# Patient Record
Sex: Female | Born: 1955 | Race: White | Hispanic: No | Smoking: Never smoker
Health system: Southern US, Community
[De-identification: ages and names within clinical notes are randomized; demographics above are authoritative.]

## PROBLEM LIST (undated history)

## (undated) DIAGNOSIS — E079 Disorder of thyroid, unspecified: Secondary | ICD-10-CM

## (undated) DIAGNOSIS — I639 Cerebral infarction, unspecified: Secondary | ICD-10-CM

## (undated) HISTORY — PX: TONSILLECTOMY: SUR1361

## (undated) HISTORY — PX: ADENOIDECTOMY: SUR15

## (undated) HISTORY — PX: CHOLECYSTECTOMY: SHX55

## (undated) HISTORY — PX: WRIST SURGERY: SHX841

## (undated) HISTORY — PX: APPENDECTOMY: SHX54

---

## 2016-11-20 ENCOUNTER — Emergency Department (HOSPITAL_COMMUNITY): Payer: Medicaid Other

## 2016-11-20 ENCOUNTER — Observation Stay (HOSPITAL_COMMUNITY)
Admission: EM | Admit: 2016-11-20 | Discharge: 2016-11-21 | Disposition: A | Discharge status: Home / Self Care | Payer: Medicaid Other | Attending: Family Medicine | Admitting: Family Medicine

## 2016-11-20 ENCOUNTER — Observation Stay (HOSPITAL_COMMUNITY): Payer: Medicaid Other

## 2016-11-20 ENCOUNTER — Encounter (HOSPITAL_COMMUNITY): Payer: Self-pay

## 2016-11-20 DIAGNOSIS — R531 Weakness: Secondary | ICD-10-CM | POA: Diagnosis not present

## 2016-11-20 DIAGNOSIS — E039 Hypothyroidism, unspecified: Secondary | ICD-10-CM | POA: Diagnosis present

## 2016-11-20 DIAGNOSIS — R079 Chest pain, unspecified: Secondary | ICD-10-CM

## 2016-11-20 DIAGNOSIS — R072 Precordial pain: Principal | ICD-10-CM | POA: Insufficient documentation

## 2016-11-20 DIAGNOSIS — R202 Paresthesia of skin: Secondary | ICD-10-CM | POA: Diagnosis present

## 2016-11-20 DIAGNOSIS — R200 Anesthesia of skin: Secondary | ICD-10-CM | POA: Diagnosis not present

## 2016-11-20 DIAGNOSIS — E038 Other specified hypothyroidism: Secondary | ICD-10-CM | POA: Diagnosis not present

## 2016-11-20 DIAGNOSIS — I639 Cerebral infarction, unspecified: Secondary | ICD-10-CM

## 2016-11-20 HISTORY — DX: Disorder of thyroid, unspecified: E07.9

## 2016-11-20 HISTORY — DX: Cerebral infarction, unspecified: I63.9

## 2016-11-20 LAB — DIFFERENTIAL
BASOS ABS: 0.1 10*3/uL (ref 0.0–0.1)
BASOS PCT: 1 %
EOS ABS: 0.1 10*3/uL (ref 0.0–0.7)
EOS PCT: 2 %
Lymphocytes Relative: 42 %
Lymphs Abs: 2.7 10*3/uL (ref 0.7–4.0)
Monocytes Absolute: 0.4 10*3/uL (ref 0.1–1.0)
Monocytes Relative: 7 %
NEUTROS PCT: 48 %
Neutro Abs: 3.2 10*3/uL (ref 1.7–7.7)

## 2016-11-20 LAB — CBC
HCT: 43.1 % (ref 36.0–46.0)
Hemoglobin: 14.8 g/dL (ref 12.0–15.0)
MCH: 31 pg (ref 26.0–34.0)
MCHC: 34.3 g/dL (ref 30.0–36.0)
MCV: 90.2 fL (ref 78.0–100.0)
Platelets: 283 10*3/uL (ref 150–400)
RBC: 4.78 MIL/uL (ref 3.87–5.11)
RDW: 13 % (ref 11.5–15.5)
WBC: 6.5 10*3/uL (ref 4.0–10.5)

## 2016-11-20 LAB — I-STAT CHEM 8, ED
BUN: 15 mg/dL (ref 6–20)
CALCIUM ION: 1.12 mmol/L — AB (ref 1.15–1.40)
Chloride: 107 mmol/L (ref 101–111)
Creatinine, Ser: 0.7 mg/dL (ref 0.44–1.00)
Glucose, Bld: 100 mg/dL — ABNORMAL HIGH (ref 65–99)
HCT: 42 % (ref 36.0–46.0)
HEMOGLOBIN: 14.3 g/dL (ref 12.0–15.0)
Potassium: 3.8 mmol/L (ref 3.5–5.1)
SODIUM: 139 mmol/L (ref 135–145)
TCO2: 23 mmol/L (ref 0–100)

## 2016-11-20 LAB — PROTIME-INR
INR: 0.88
PROTHROMBIN TIME: 11.9 s (ref 11.4–15.2)

## 2016-11-20 LAB — I-STAT TROPONIN, ED: Troponin i, poc: 0 ng/mL (ref 0.00–0.08)

## 2016-11-20 LAB — TROPONIN I

## 2016-11-20 LAB — COMPREHENSIVE METABOLIC PANEL
ALT: 28 U/L (ref 14–54)
ANION GAP: 10 (ref 5–15)
AST: 20 U/L (ref 15–41)
Albumin: 4.1 g/dL (ref 3.5–5.0)
Alkaline Phosphatase: 91 U/L (ref 38–126)
BUN: 15 mg/dL (ref 6–20)
CHLORIDE: 103 mmol/L (ref 101–111)
CO2: 25 mmol/L (ref 22–32)
Calcium: 9.3 mg/dL (ref 8.9–10.3)
Creatinine, Ser: 0.86 mg/dL (ref 0.44–1.00)
GFR calc Af Amer: >60 mL/min (ref >60)
Glucose, Bld: 101 mg/dL — ABNORMAL HIGH (ref 65–99)
POTASSIUM: 3.7 mmol/L (ref 3.5–5.1)
Sodium: 138 mmol/L (ref 135–145)
TOTAL PROTEIN: 7.3 g/dL (ref 6.5–8.1)
Total Bilirubin: 0.5 mg/dL (ref 0.3–1.2)

## 2016-11-20 LAB — CBG MONITORING, ED: GLUCOSE-CAPILLARY: 102 mg/dL — AB (ref 65–99)

## 2016-11-20 LAB — D-DIMER, QUANTITATIVE (NOT AT ARMC): D DIMER QUANT: 0.37 ug{FEU}/mL (ref 0.00–0.50)

## 2016-11-20 LAB — APTT: aPTT: 30 seconds (ref 24–36)

## 2016-11-20 MED ORDER — ASPIRIN 81 MG PO CHEW: 324.0000 mg | CHEWABLE_TABLET | Freq: Once | ORAL | Status: DC

## 2016-11-20 MED ORDER — MORPHINE SULFATE (PF) 2 MG/ML IV SOLN
2.0000 mg | INTRAVENOUS | Status: AC
  Administered 2016-11-20: 2 mg via INTRAVENOUS
  Filled 2016-11-20: qty 1

## 2016-11-20 MED ORDER — ASPIRIN 300 MG RE SUPP: 300.0000 mg | Freq: Every day | RECTAL | Status: DC

## 2016-11-20 MED ORDER — ACETAMINOPHEN 160 MG/5ML PO SOLN: 650.0000 mg | ORAL | Status: DC | PRN

## 2016-11-20 MED ORDER — ENOXAPARIN SODIUM 40 MG/0.4ML ~~LOC~~ SOLN
40.0000 mg | SUBCUTANEOUS | Status: DC
  Administered 2016-11-20: 40 mg via SUBCUTANEOUS
  Filled 2016-11-20: qty 0.4

## 2016-11-20 MED ORDER — LORAZEPAM 2 MG/ML IJ SOLN
2.0000 mg | Freq: Once | INTRAMUSCULAR | Status: AC
  Administered 2016-11-20: 2 mg via INTRAVENOUS
  Filled 2016-11-20: qty 1

## 2016-11-20 MED ORDER — LEVOTHYROXINE SODIUM 25 MCG PO TABS
125.0000 ug | ORAL_TABLET | Freq: Every day | ORAL | Status: DC
  Administered 2016-11-21: 125 ug via ORAL
  Filled 2016-11-20: qty 1

## 2016-11-20 MED ORDER — ACETAMINOPHEN 325 MG PO TABS: 650.0000 mg | ORAL_TABLET | ORAL | Status: DC | PRN

## 2016-11-20 MED ORDER — ACETAMINOPHEN 650 MG RE SUPP: 650.0000 mg | RECTAL | Status: DC | PRN

## 2016-11-20 MED ORDER — SENNOSIDES-DOCUSATE SODIUM 8.6-50 MG PO TABS: 1.0000 | ORAL_TABLET | Freq: Every evening | ORAL | Status: DC | PRN

## 2016-11-20 MED ORDER — ASPIRIN 325 MG PO TABS
325.0000 mg | ORAL_TABLET | Freq: Every day | ORAL | Status: DC
  Administered 2016-11-20 – 2016-11-21 (×2): 325 mg via ORAL
  Filled 2016-11-20 (×2): qty 1

## 2016-11-20 MED ORDER — STROKE: EARLY STAGES OF RECOVERY BOOK
Freq: Once | Status: AC
  Administered 2016-11-20: 18:00:00
  Filled 2016-11-20: qty 1

## 2016-11-20 MED ORDER — SODIUM CHLORIDE 0.9 % IV SOLN: INTRAVENOUS | Status: DC

## 2016-11-20 NOTE — ED Provider Notes (Signed)
[facility]-EMERGENCY DEPT Provider Note   CSN: [id number] Arrival date & time: 11/20/16  1241   An emergency department physician performed an initial assessment on this suspected stroke patient at 1250.  History   Chief Complaint Chief Complaint  Patient presents with  . facial numbness  . Chest Pain    HPI Valerie Turner is a 61 y.o. female.  Patient presents with 2 complaints. One is chest pain the other is left-sided facial numbness. Chest pain complaint is been a discomfort substernal lasting of 2 days but it is intermittent. The other is that about an hour prior to arrival developed left-sided facial numbness similar to her stroke in 2004. No motor deficits no speech problems. No trouble with the tongue. Patient is originally from the [place] area. That was in [place] with her daughter and drove home from [place] came to this area could she has follow-up appointments at [facility] medically patient's daughter does she got in around 11:00 so she was taking a nap today when she awoke at 1145 started having numbness to the face.      Past Medical History:  Diagnosis Date  . Stroke (HCC)   . Thyroid disease     There are no active problems to display for this patient.   Past Surgical History:  Procedure Laterality Date  . ADENOIDECTOMY    . CHOLECYSTECTOMY    . TONSILLECTOMY    . WRIST SURGERY      OB History    No data available       Home Medications    Prior to Admission medications   Not on File    Family History No family history on file.  Social History Social History  Substance Use Topics  . Smoking status: Never Smoker  . Smokeless tobacco: Never Used  . Alcohol use No     Allergies   Other   Review of Systems Review of Systems  Constitutional: Negative for fever.  HENT: Negative for congestion.   Eyes: Negative for visual disturbance.  Respiratory: Negative for shortness of breath.   Cardiovascular: Positive for chest pain.    Gastrointestinal: Negative for abdominal pain, nausea and vomiting.  Genitourinary: Negative for dysuria.  Musculoskeletal: Negative for neck pain.  Skin: Negative for rash.  Neurological: Positive for numbness. Negative for facial asymmetry and headaches.  Hematological: Does not bruise/bleed easily.  Psychiatric/Behavioral: Negative for confusion.     Physical Exam Updated Vital Signs BP 128/83   Pulse 90   Temp 98.1 F (36.7 C)   Resp 19   Ht 1.626 m (5\' 4" )   Wt 88.5 kg (195 lb)   SpO2 94%   BMI 33.47 kg/m   Physical Exam  Constitutional: She is oriented to person, place, and time. She appears well-developed and well-nourished.  HENT:  Head: Normocephalic and atraumatic.  Mouth/Throat: Oropharynx is clear and moist.  Eyes: Conjunctivae and EOM are normal. Pupils are equal, round, and reactive to light.  Neck: Normal range of motion. Neck supple.  Cardiovascular: Normal rate, regular rhythm and normal heart sounds.   Pulmonary/Chest: Effort normal and breath sounds normal.  Abdominal: Soft. Bowel sounds are normal. There is no tenderness.  Musculoskeletal: Normal range of motion. She exhibits no edema.  Neurological: She is alert and oriented to person, place, and time. A cranial nerve deficit is present. No sensory deficit. She exhibits normal muscle tone. Coordination normal.  Patient without any facial weakness but does strictly has numbness to the left side  of the face. No motor or sensory deficit. No obvious speech problem no visual changes.  Skin: Skin is warm.  Nursing note and vitals reviewed.    ED Treatments / Results  Labs (all labs ordered are listed, but only abnormal results are displayed) Labs Reviewed  COMPREHENSIVE METABOLIC PANEL - Abnormal; Notable for the following:       Result Value   Glucose, Bld 101 (*)    All other components within normal limits  CBG MONITORING, ED - Abnormal; Notable for the following:    Glucose-Capillary 102 (*)     All other components within normal limits  I-STAT CHEM 8, ED - Abnormal; Notable for the following:    Glucose, Bld 100 (*)    Calcium, Ion 1.12 (*)    All other components within normal limits  PROTIME-INR  APTT  CBC  DIFFERENTIAL  I-STAT TROPOININ, ED   Results for orders placed or performed during the hospital encounter of 11/20/16  Protime-INR  Result Value Ref Range   Prothrombin Time 11.9 11.4 - 15.2 seconds   INR 0.88   APTT  Result Value Ref Range   aPTT 30 24 - 36 seconds  CBC  Result Value Ref Range   WBC 6.5 4.0 - 10.5 K/uL   RBC 4.78 3.87 - 5.11 MIL/uL   Hemoglobin 14.8 12.0 - 15.0 g/dL   HCT 16.1 09.6 - 04.5 %   MCV 90.2 78.0 - 100.0 fL   MCH 31.0 26.0 - 34.0 pg   MCHC 34.3 30.0 - 36.0 g/dL   RDW 40.9 81.1 - 91.4 %   Platelets 283 150 - 400 K/uL  Differential  Result Value Ref Range   Neutrophils Relative % 48 %   Neutro Abs 3.2 1.7 - 7.7 K/uL   Lymphocytes Relative 42 %   Lymphs Abs 2.7 0.7 - 4.0 K/uL   Monocytes Relative 7 %   Monocytes Absolute 0.4 0.1 - 1.0 K/uL   Eosinophils Relative 2 %   Eosinophils Absolute 0.1 0.0 - 0.7 K/uL   Basophils Relative 1 %   Basophils Absolute 0.1 0.0 - 0.1 K/uL  Comprehensive metabolic panel  Result Value Ref Range   Sodium 138 135 - 145 mmol/L   Potassium 3.7 3.5 - 5.1 mmol/L   Chloride 103 101 - 111 mmol/L   CO2 25 22 - 32 mmol/L   Glucose, Bld 101 (H) 65 - 99 mg/dL   BUN 15 6 - 20 mg/dL   Creatinine, Ser 7.82 0.44 - 1.00 mg/dL   Calcium 9.3 8.9 - 95.6 mg/dL   Total Protein 7.3 6.5 - 8.1 g/dL   Albumin 4.1 3.5 - 5.0 g/dL   AST 20 15 - 41 U/L   ALT 28 14 - 54 U/L   Alkaline Phosphatase 91 38 - 126 U/L   Total Bilirubin 0.5 0.3 - 1.2 mg/dL   GFR calc non Af Amer >60 >60 mL/min   GFR calc Af Amer >60 >60 mL/min   Anion gap 10 5 - 15  I-stat troponin, ED  Result Value Ref Range   Troponin i, poc 0.00 0.00 - 0.08 ng/mL   Comment 3          CBG monitoring, ED  Result Value Ref Range   Glucose-Capillary  102 (H) 65 - 99 mg/dL  I-Stat Chem 8, ED  Result Value Ref Range   Sodium 139 135 - 145 mmol/L   Potassium 3.8 3.5 - 5.1 mmol/L   Chloride 107 101 -  111 mmol/L   BUN 15 6 - 20 mg/dL   Creatinine, Ser 9.60 0.44 - 1.00 mg/dL   Glucose, Bld 454 (H) 65 - 99 mg/dL   Calcium, Ion 0.98 (L) 1.15 - 1.40 mmol/L   TCO2 23 0 - 100 mmol/L   Hemoglobin 14.3 12.0 - 15.0 g/dL   HCT 11.9 14.7 - 82.9 %     EKG  EKG Interpretation  Date/Time:  Tuesday Nov 20 2016 13:03:29 EDT Ventricular Rate:  91 PR Interval:    QRS Duration: 95 QT Interval:  367 QTC Calculation: 452 R Axis:   38 Text Interpretation:  Sinus rhythm Low voltage, precordial leads Baseline wander in lead(s) V3 V4 No previous ECGs available Confirmed by Vanetta Mulders [phone]) on 11/20/2016 1:09:56 PM       Radiology Ct Head Wo Contrast  Result Date: 11/20/2016 CLINICAL DATA:  Left facial numbness. EXAM: CT HEAD WITHOUT CONTRAST TECHNIQUE: Contiguous axial images were obtained from the base of the skull through the vertex without intravenous contrast. COMPARISON:  None. FINDINGS: Brain: There is no evidence of acute infarct, intracranial hemorrhage, mass, midline shift, or extra-axial fluid collection. The ventricles and sulci are normal. Vascular: No hyperdense vessel. Skull: No fracture or focal osseous lesion. Sinuses/Orbits: Visualized paranasal sinuses and mastoid air cells are clear. Orbits are unremarkable. Other: None. IMPRESSION: Negative head CT. These results were called by telephone at the time of interpretation on 11/20/2016 at 1:06 pm to Dr. Clarene Duke, who verbally acknowledged these results. Electronically Signed   By: Sebastian Ache M.D.   On: 11/20/2016 13:07   Dg Chest Port 1 View  Result Date: 11/20/2016 CLINICAL DATA:  Chest pain EXAM: PORTABLE CHEST 1 VIEW COMPARISON:  None. FINDINGS: The heart size and mediastinal contours are within normal limits. Both lungs are clear. The visualized skeletal structures are  unremarkable. IMPRESSION: No active disease. Electronically Signed   By: Marlan Palau M.D.   On: 11/20/2016 13:56    Procedures Procedures (including critical care time)  CRITICAL CARE Performed by: Vanetta Mulders Total critical care time: 30 minutes Critical care time was exclusive of separately billable procedures and treating other patients. Critical care was necessary to treat or prevent imminent or life-threatening deterioration. Critical care was time spent personally by me on the following activities: development of treatment plan with patient and/or surrogate as well as nursing, discussions with consultants, evaluation of patient's response to treatment, examination of patient, obtaining history from patient or surrogate, ordering and performing treatments and interventions, ordering and review of laboratory studies, ordering and review of radiographic studies, pulse oximetry and re-evaluation of patient's condition.     Medications Ordered in ED Medications  aspirin chewable tablet 324 mg (not administered)     Initial Impression / Assessment and Plan / ED Course  I have reviewed the triage vital signs and the nursing notes.  Pertinent labs & imaging results that were available during my care of the patient were reviewed by me and considered in my medical decision making (see chart for details).    Patient will require admission for CVA of rule out in the chest pain rule out. Patient was inactivated code stroke evaluated by neurology. Patient clearly not a TPA candidate. Left-sided facial symptoms still present. I tried to order MR MRA but patient absolutely refuses MR studies due to his severe claustrophobia. Patient's initial chest pain workup the things without any acute findings. Discussed with the hospitalist they will admit. Patient's head CT obviously was negative for any  acute findings.   Final Clinical Impressions(s) / ED Diagnoses   Final diagnoses:  Precordial  pain  Cerebrovascular accident (CVA), unspecified mechanism (HCC)    New Prescriptions New Prescriptions   No medications on file     Vanetta MuldersZackowski, Inri Sobieski, MD 11/20/16 [id number]

## 2016-11-20 NOTE — ED Notes (Signed)
Pt returned from ct

## 2016-11-20 NOTE — ED Notes (Signed)
Swallow eval called and aware of pt

## 2016-11-20 NOTE — Progress Notes (Signed)
CALL FROM RN [phone] BEEPER 1248 PT ON TABLE 1254 EXAM FINISHED 1256  IMAGES SENT TO SOC 1259 CALLED GR [phone]

## 2016-11-20 NOTE — ED Triage Notes (Signed)
Pt reports chest discomfort for the past 2 days.  [place] and got home around 11pm last night.  Reports she woke up from a nap and at approx 1145 she started having numbness to left side of face.  Denies extremity weakness or numbness.  Denies difficulty speaking or talking.  Face symmetrical.  EDP at bedside.

## 2016-11-20 NOTE — ED Notes (Signed)
Decision made by TTS for no TPA.

## 2016-11-20 NOTE — H&P (Addendum)
History and Physical    Valerie Turner XBJ:[id number] DOB: 04/29/1956 DOA: 11/20/2016  PCP: System, Pcp Not In  Patient coming from: home  I have personally briefly reviewed patient's old medical records in [facility]  Chief Complaint: Left facial numbness  HPI: Valerie Turner is a 61 y.o. female with medical history significant of previous stroke and hypothyroidism, presents to the hospital today with left facial numbness and tingling. Patient resides in [place] and is visiting friends in [place]. She recently drove up from [place] within the past few days. She reports that she noted left-sided facial numbness and tingling spreading over her left face, that began earlier this morning after she woke. she denies any changes in vision, difficulty swallowing, difficulty walking, unilateral weakness. She also complains of chest discomfort. She reports she has had this intermittently over the last few days. Symptoms are not related to exertion and can occur at rest. Discomfort is substernal, nonradiating, dull pain. There is no associated shortness of breath, diaphoresis, nausea, lightheadedness. Symptoms last a few minutes and resolve on their own.  ED Course: CT head unremarkable. She was initially seen as a code stroke, but after evaluation by tele neurologist, code stroke was canceled. Labs were noted to be unremarkable. EKG did not show any acute findings. She was referred for admission.  Review of Systems: As per HPI otherwise 10 point review of systems negative.    Past Medical History:  Diagnosis Date  . Stroke (HCC)   . Thyroid disease     Past Surgical History:  Procedure Laterality Date  . ADENOIDECTOMY    . APPENDECTOMY    . CHOLECYSTECTOMY    . TONSILLECTOMY    . WRIST SURGERY       reports that she has never smoked. She has never used smokeless tobacco. She reports that she does not drink alcohol or use drugs.  Allergies  Allergen Reactions  . Other      Narcotics make her bp drop, also difficulty waking up from anesthesia    Family history: Father and one sibling died at an early age of heart disease. She has 2 other siblings that have heart disease at a very early age.  Prior to Admission medications   Medication Sig Start Date End Date Taking? Authorizing Provider  levothyroxine (SYNTHROID, LEVOTHROID) 125 MCG tablet Take 125 mcg by mouth daily before breakfast.   Yes [provider]  naproxen (NAPROSYN) 500 MG tablet Take 500 mg by mouth daily as needed for moderate pain.   Yes [provider]  propranolol (INDERAL) 20 MG tablet Take 20 mg by mouth daily as needed (high blood pressure).   Yes [provider]    Physical Exam: Vitals:   11/20/16 1430 11/20/16 1500 11/20/16 1530 11/20/16 1551  BP: 123/79 130/84 113/86   Pulse: 88 97 88   Resp: 16 (!) 26 20   Temp:    97.1 F (36.2 C)  TempSrc:    Oral  SpO2: 95% 97% 96%   Weight:      Height:        Constitutional: NAD, calm, comfortable Vitals:   11/20/16 1430 11/20/16 1500 11/20/16 1530 11/20/16 1551  BP: 123/79 130/84 113/86   Pulse: 88 97 88   Resp: 16 (!) 26 20   Temp:    97.1 F (36.2 C)  TempSrc:    Oral  SpO2: 95% 97% 96%   Weight:      Height:  Eyes: PERRL, lids and conjunctivae normal ENMT: Mucous membranes are moist. Posterior pharynx clear of any exudate or lesions.Normal dentition.  Neck: normal, supple, no masses, no thyromegaly Respiratory: clear to auscultation bilaterally, no wheezing, no crackles. Normal respiratory effort. No accessory muscle use.  Cardiovascular: Regular rate and rhythm, no murmurs / rubs / gallops. No extremity edema. 2+ pedal pulses. No carotid bruits.  Abdomen: no tenderness, no masses palpated. No hepatosplenomegaly. Bowel sounds positive.  Musculoskeletal: no clubbing / cyanosis. No joint deformity upper and lower extremities. Good ROM, no contractures. Normal muscle tone.  Skin: no rashes,  lesions, ulcers. No induration Neurologic: Strength 5 out of 5 in the right upper and lower extremities. 45 in the left upper and lower extremities. Positive pronator drift, left. Cranial nerves appear to be grossly intact. Psychiatric: Normal judgment and insight. Alert and oriented x 3. Normal mood.     Labs on Admission: I have personally reviewed following labs and imaging studies  CBC:  Recent Labs Lab 11/20/16 1249 11/20/16 1329  WBC 6.5  --   NEUTROABS 3.2  --   HGB 14.8 14.3  HCT 43.1 42.0  MCV 90.2  --   PLT 283  --    Basic Metabolic Panel:  Recent Labs Lab 11/20/16 1249 11/20/16 1329  NA 138 139  K 3.7 3.8  CL 103 107  CO2 25  --   GLUCOSE 101* 100*  BUN 15 15  CREATININE 0.86 0.70  CALCIUM 9.3  --    GFR: Estimated Creatinine Clearance: 81.5 mL/min (by C-G formula based on SCr of 0.7 mg/dL). Liver Function Tests:  Recent Labs Lab 11/20/16 1249  AST 20  ALT 28  ALKPHOS 91  BILITOT 0.5  PROT 7.3  ALBUMIN 4.1   No results for input(s): LIPASE, AMYLASE in the last 168 hours. No results for input(s): AMMONIA in the last 168 hours. Coagulation Profile:  Recent Labs Lab 11/20/16 1249  INR 0.88   Cardiac Enzymes: No results for input(s): CKTOTAL, CKMB, CKMBINDEX, TROPONINI in the last 168 hours. BNP (last 3 results) No results for input(s): PROBNP in the last 8760 hours. HbA1C: No results for input(s): HGBA1C in the last 72 hours. CBG:  Recent Labs Lab 11/20/16 1303  GLUCAP 102*   Lipid Profile: No results for input(s): CHOL, HDL, LDLCALC, TRIG, CHOLHDL, LDLDIRECT in the last 72 hours. Thyroid Function Tests: No results for input(s): TSH, T4TOTAL, FREET4, T3FREE, THYROIDAB in the last 72 hours. Anemia Panel: No results for input(s): VITAMINB12, FOLATE, FERRITIN, TIBC, IRON, RETICCTPCT in the last 72 hours. Urine analysis: No results found for: COLORURINE, APPEARANCEUR, LABSPEC, PHURINE, GLUCOSEU, HGBUR, BILIRUBINUR, KETONESUR,  PROTEINUR, UROBILINOGEN, NITRITE, LEUKOCYTESUR  Radiological Exams on Admission: Ct Head Wo Contrast  Result Date: 11/20/2016 CLINICAL DATA:  Left facial numbness. EXAM: CT HEAD WITHOUT CONTRAST TECHNIQUE: Contiguous axial images were obtained from the base of the skull through the vertex without intravenous contrast. COMPARISON:  None. FINDINGS: Brain: There is no evidence of acute infarct, intracranial hemorrhage, mass, midline shift, or extra-axial fluid collection. The ventricles and sulci are normal. Vascular: No hyperdense vessel. Skull: No fracture or focal osseous lesion. Sinuses/Orbits: Visualized paranasal sinuses and mastoid air cells are clear. Orbits are unremarkable. Other: None. IMPRESSION: Negative head CT. These results were called by telephone at the time of interpretation on 11/20/2016 at 1:06 pm to Dr. Clarene Duke, who verbally acknowledged these results. Electronically Signed   By: Sebastian Ache M.D.   On: 11/20/2016 13:07   Dg Chest  Port 1 View  Result Date: 11/20/2016 CLINICAL DATA:  Chest pain EXAM: PORTABLE CHEST 1 VIEW COMPARISON:  None. FINDINGS: The heart size and mediastinal contours are within normal limits. Both lungs are clear. The visualized skeletal structures are unremarkable. IMPRESSION: No active disease. Electronically Signed   By: Marlan Palauharles  Clark M.D.   On: 11/20/2016 13:56    EKG: Independently reviewed. Sinus rhythm without any acute ST/T changes  Assessment/Plan Active Problems:   Chest pain   Numbness and tingling of left side of face   Left-sided weakness   Hypothyroidism     1. Left sided weakness with left facial numbness and tingling. Concern for underlying CVA. CT head is negative. Patient is very claustrophobic, but will attempt MRI after receiving Ativan. Other workup including carotid Dopplers, echocardiogram have been ordered. Neurology consult. Start on aspirin. 2. Hypothyroidism. Continue Synthroid 3. Chest pain. Patient atypical. Monitor on  telemetry. Cycle cardiac markers. If workup is negative, consider outpatient cardiology evaluation. Since she's been recently traveling, will check d-dimer.  DVT prophylaxis: Lovenox Code Status: Full code Family Communication: Discussed with daughters at the bedside Disposition Plan: Discharge home once improved Consults called: Neurology Admission status: Observation, telemetry   MEMON,JEHANZEB MD [facility] Pager [phone]  If 7PM-7AM, please contact night-coverage [email or web address] Password [email or web address]  11/20/2016, 5:33 PM

## 2016-11-21 ENCOUNTER — Observation Stay (HOSPITAL_BASED_OUTPATIENT_CLINIC_OR_DEPARTMENT_OTHER): Payer: Medicaid Other

## 2016-11-21 ENCOUNTER — Observation Stay (HOSPITAL_COMMUNITY): Payer: Medicaid Other

## 2016-11-21 DIAGNOSIS — R072 Precordial pain: Principal | ICD-10-CM

## 2016-11-21 DIAGNOSIS — I6789 Other cerebrovascular disease: Secondary | ICD-10-CM

## 2016-11-21 DIAGNOSIS — R531 Weakness: Secondary | ICD-10-CM | POA: Diagnosis not present

## 2016-11-21 LAB — LIPID PANEL
CHOL/HDL RATIO: 7 ratio
CHOLESTEROL: 237 mg/dL — AB (ref 0–200)
HDL: 34 mg/dL — AB (ref >40)
LDL Cholesterol: 164 mg/dL — ABNORMAL HIGH (ref 0–99)
TRIGLYCERIDES: 194 mg/dL — AB (ref <150)
VLDL: 39 mg/dL (ref 0–40)

## 2016-11-21 LAB — TROPONIN I

## 2016-11-21 LAB — HIV ANTIBODY (ROUTINE TESTING W REFLEX): HIV SCREEN 4TH GENERATION: NONREACTIVE

## 2016-11-21 LAB — ECHOCARDIOGRAM COMPLETE
Height: 64 in
WEIGHTICAEL: 3120 [oz_av]

## 2016-11-21 MED ORDER — ATORVASTATIN CALCIUM 10 MG PO TABS: ORAL_TABLET | ORAL | 1 refills | Status: AC

## 2016-11-21 MED ORDER — ASPIRIN EC 81 MG PO TBEC: 81.0000 mg | DELAYED_RELEASE_TABLET | Freq: Every day | ORAL | 2 refills | Status: AC

## 2016-11-21 NOTE — Discharge Summary (Signed)
Valerie Turner, is a 61 y.o. female  DOB 1955-09-26  MRN [id number].  Admission date:  11/20/2016  Admitting Physician  Erick Blinks, MD  Discharge Date:  11/21/2016   Primary MD  System, Pcp Not In  Recommendations for primary care physician for things to follow:   Outpatient stress test   Admission Diagnosis  Precordial pain [R07.2] Cerebrovascular accident (CVA), unspecified mechanism (HCC) [I63.9]   Discharge Diagnosis  Precordial pain [R07.2] Cerebrovascular accident (CVA), unspecified mechanism (HCC) [I63.9]    Active Problems:   Chest pain   Numbness and tingling of left side of face   Left-sided weakness   Hypothyroidism      Past Medical History:  Diagnosis Date  . Stroke (HCC)   . Thyroid disease     Past Surgical History:  Procedure Laterality Date  . ADENOIDECTOMY    . APPENDECTOMY    . CHOLECYSTECTOMY    . TONSILLECTOMY    . WRIST SURGERY         HPI  from the history and physical done on the day of admission:    Chief Complaint: Left facial numbness  HPI: Valerie Turner is a 61 y.o. female with medical history significant of previous stroke and hypothyroidism, presents to the hospital today with left facial numbness and tingling. Patient resides in [place] and is visiting friends in [place]. She recently drove up from [place] within the past few days. She reports that she noted left-sided facial numbness and tingling spreading over her left face, that began earlier this morning after she woke. she denies any changes in vision, difficulty swallowing, difficulty walking, unilateral weakness. She also complains of chest discomfort. She reports she has had this intermittently over the last few days. Symptoms are not related to exertion and can occur at rest. Discomfort is substernal, nonradiating, dull pain. There is no associated shortness of breath,  diaphoresis, nausea, lightheadedness. Symptoms last a few minutes and resolve on their own.  ED Course: CT head unremarkable. She was initially seen as a code stroke, but after evaluation by tele neurologist, code stroke was canceled. Labs were noted to be unremarkable. EKG did not show any acute findings. She was referred for admission.  Review of Systems: As per HPI otherwise 10 point review of systems negative.      Hospital Course:     1)Lt Sided Facial Weakness/Tingling- Resolved, CT head unremarkable, MRI of the brain and MRA head without acute findings, carotid artery Dopplers without hemodynamically significant stenosis. Cannot rule out possible TIA, treat empirically with aspirin and Lipitor and follow-up with PCP  2)Atypical Chest Discomfort- patient ruled out for ACS by cardiac enzymes and EKG, on telemetry monitored unit no significant arrhythmias, echocardiogram with preserved EF over 55%, there is Possible mild hypokinesis of  the mid anterior and mid anteroseptal myocardium. Patient is advised to take aspirin, Lipitor and propranolol, follow-up with PCP for outpatient stress test and further cardiovascular evaluation. LDL is 164 and HDL is 34  Discharge Condition: stable, chest pain-free without acute  neuro symptoms  Follow UP  Follow-up Information    PCP. Schedule an appointment as soon as possible for a visit in 1 week(s).   Why:  Follow-up with her primary doctor within the week to schedule outpatient stress test           Diet and Activity recommendation:  As advised  Discharge Instructions     outpatient stress test advised   Discharge Medications     Allergies as of 11/21/2016      Reactions   Other    Narcotics make her bp drop, also difficulty waking up from anesthesia      Medication List    STOP taking these medications   naproxen 500 MG tablet Commonly known as:  NAPROSYN     TAKE these medications   aspirin EC 81 MG tablet Take 1 tablet  (81 mg total) by mouth daily. take with food   atorvastatin 10 MG tablet Commonly known as:  LIPITOR Take 1 tablet every evening for cholesterol   levothyroxine 125 MCG tablet Commonly known as:  SYNTHROID, LEVOTHROID Take 125 mcg by mouth daily before breakfast.   propranolol 20 MG tablet Commonly known as:  INDERAL Take 20 mg by mouth daily as needed (high blood pressure).       Major procedures and Radiology Reports - PLEASE review detailed and final reports for all details, in brief -   Ct Head Wo Contrast  Result Date: 11/20/2016 CLINICAL DATA:  Left facial numbness. EXAM: CT HEAD WITHOUT CONTRAST TECHNIQUE: Contiguous axial images were obtained from the base of the skull through the vertex without intravenous contrast. COMPARISON:  None. FINDINGS: Brain: There is no evidence of acute infarct, intracranial hemorrhage, mass, midline shift, or extra-axial fluid collection. The ventricles and sulci are normal. Vascular: No hyperdense vessel. Skull: No fracture or focal osseous lesion. Sinuses/Orbits: Visualized paranasal sinuses and mastoid air cells are clear. Orbits are unremarkable. Other: None. IMPRESSION: Negative head CT. These results were called by telephone at the time of interpretation on 11/20/2016 at 1:06 pm to Dr. Clarene DukeMCMANUS, who verbally acknowledged these results. Electronically Signed   By: Sebastian AcheAllen  Grady M.D.   On: 11/20/2016 13:07   Mr Brain Wo Contrast  Result Date: 11/20/2016 CLINICAL DATA:  LEFT facial numbness, assess for stroke. EXAM: MRI HEAD WITHOUT CONTRAST MRA HEAD WITHOUT CONTRAST TECHNIQUE: Multiplanar, multiecho pulse sequences of the brain and surrounding structures were obtained without intravenous contrast. Angiographic images of the head were obtained using MRA technique without contrast. COMPARISON:  CT HEAD Nov 20, 2016 at 1254 hours FINDINGS: MRI HEAD FINDINGS BRAIN: No reduced diffusion to suggest acute ischemia. No susceptibility artifact to suggest  hemorrhage. The ventricles and sulci are normal for patient's age. Scattered subcentimeter supratentorial white matter FLAIR T2 hyperintensities in a nonspecific distribution including LEFT temporal lobe. No suspicious parenchymal signal, masses or mass effect. No abnormal extra-axial fluid collections. VASCULAR: Normal major intracranial vascular flow voids present at skull base. SKULL AND UPPER CERVICAL SPINE: No abnormal sellar expansion. No suspicious calvarial bone marrow signal. Craniocervical junction maintained. SINUSES/ORBITS: Trace paranasal sinus mucosal thickening. Mastoid air cells are well aerated. The included ocular globes and orbital contents are non-suspicious. OTHER: None. MRA HEAD FINDINGS ANTERIOR CIRCULATION: Normal flow related enhancement of the included cervical, petrous, cavernous and supraclinoid internal carotid arteries. Patent anterior communicating artery. Normal flow related enhancement of the anterior and middle cerebral arteries, including distal segments. No large vessel occlusion, high-grade stenosis, abnormal luminal irregularity, aneurysm. POSTERIOR CIRCULATION:  vertebral artery is dominant. Basilar artery is patent, with normal flow related enhancement of the main branch vessels. Normal flow related enhancement of the posterior cerebral arteries. No large vessel occlusion, high-grade stenosis, abnormal luminal irregularity, aneurysm. ANATOMIC VARIANTS: None. Source images and MIP images were reviewed. IMPRESSION: MRI HEAD: No acute intracranial process. Mild to moderate white matter changes seen with chronic small vessel ischemic disease, migraine, prior infection; atypical distribution for classic demyelination. Findings also associated with vasculopathy though, unlikely given normal MRA head. MRA HEAD: Normal. Electronically Signed   By: Awilda Metro M.D.   On: 11/20/2016 19:33   US Carotid Bilateral (at [facility] Only)  Result Date: 11/21/2016 CLINICAL DATA:  Left  facial numbness. Left-sided weakness. Prior CVA. EXAM: BILATERAL CAROTID DUPLEX ULTRASOUND TECHNIQUE: Wallace Cullens scale imaging, color Doppler and duplex ultrasound were performed of bilateral carotid and vertebral arteries in the neck. COMPARISON:  MRI 11/20/2016. FINDINGS: Criteria: Quantification of carotid stenosis is based on velocity parameters that correlate the residual internal carotid diameter with NASCET-based stenosis levels, using the diameter of the distal internal carotid lumen as the denominator for stenosis measurement. The following velocity measurements were obtained: RIGHT ICA:  93/38 cm/sec CCA:  84/23 cm/sec SYSTOLIC ICA/CCA RATIO:  1.1 DIASTOLIC ICA/CCA RATIO:  1.7 ECA:  104 cm/sec LEFT ICA:  94/34 cm/sec CCA:  94/30 cm/sec SYSTOLIC ICA/CCA RATIO:  0.8 DIASTOLIC ICA/CCA RATIO:  1.1 ECA:  64 cm/sec RIGHT CAROTID ARTERY: No significant carotid atherosclerotic vascular disease. RIGHT VERTEBRAL ARTERY:  Patent with antegrade flow. LEFT CAROTID ARTERY: Mild left carotid bifurcation/proximal ICA atherosclerotic vascular plaque. LEFT VERTEBRAL ARTERY:  Patent with antegrade flow. IMPRESSION: 1. Mild left carotid bifurcation/proximal ICA atherosclerotic vascular plaque. No flow limiting stenosis. Degree of stenosis less than 50%. 2. Right carotid is widely patent. 3. Vertebrals are patent antegrade flow. Electronically Signed   By: Maisie Fus  Register   On: 11/21/2016 08:38   Dg Chest Port 1 View  Result Date: 11/20/2016 CLINICAL DATA:  Chest pain EXAM: PORTABLE CHEST 1 VIEW COMPARISON:  None. FINDINGS: The heart size and mediastinal contours are within normal limits. Both lungs are clear. The visualized skeletal structures are unremarkable. IMPRESSION: No active disease. Electronically Signed   By: Marlan Palau M.D.   On: 11/20/2016 13:56   Mr Maxine Glenn Head/brain ZO Cm  Result Date: 11/20/2016 CLINICAL DATA:  LEFT facial numbness, assess for stroke. EXAM: MRI HEAD WITHOUT CONTRAST MRA HEAD WITHOUT  CONTRAST TECHNIQUE: Multiplanar, multiecho pulse sequences of the brain and surrounding structures were obtained without intravenous contrast. Angiographic images of the head were obtained using MRA technique without contrast. COMPARISON:  CT HEAD Nov 20, 2016 at 1254 hours FINDINGS: MRI HEAD FINDINGS BRAIN: No reduced diffusion to suggest acute ischemia. No susceptibility artifact to suggest hemorrhage. The ventricles and sulci are normal for patient's age. Scattered subcentimeter supratentorial white matter FLAIR T2 hyperintensities in a nonspecific distribution including LEFT temporal lobe. No suspicious parenchymal signal, masses or mass effect. No abnormal extra-axial fluid collections. VASCULAR: Normal major intracranial vascular flow voids present at skull base. SKULL AND UPPER CERVICAL SPINE: No abnormal sellar expansion. No suspicious calvarial bone marrow signal. Craniocervical junction maintained. SINUSES/ORBITS: Trace paranasal sinus mucosal thickening. Mastoid air cells are well aerated. The included ocular globes and orbital contents are non-suspicious. OTHER: None. MRA HEAD FINDINGS ANTERIOR CIRCULATION: Normal flow related enhancement of the included cervical, petrous, cavernous and supraclinoid internal carotid arteries. Patent anterior communicating artery. Normal flow related enhancement of the anterior and middle cerebral  arteries, including distal segments. No large vessel occlusion, high-grade stenosis, abnormal luminal irregularity, aneurysm. POSTERIOR CIRCULATION: vertebral artery is dominant. Basilar artery is patent, with normal flow related enhancement of the main branch vessels. Normal flow related enhancement of the posterior cerebral arteries. No large vessel occlusion, high-grade stenosis, abnormal luminal irregularity, aneurysm. ANATOMIC VARIANTS: None. Source images and MIP images were reviewed. IMPRESSION: MRI HEAD: No acute intracranial process. Mild to moderate white matter  changes seen with chronic small vessel ischemic disease, migraine, prior infection; atypical distribution for classic demyelination. Findings also associated with vasculopathy though, unlikely given normal MRA head. MRA HEAD: Normal. Electronically Signed   By: Awilda Metro M.D.   On: 11/20/2016 19:33    Micro Results    No results found for this or any previous visit (from the past 240 hour(s)).     Today   Subjective    Valerie Turner today has noNew complaints, she is chest pain-free without acute neuro symptoms. Patient's 2 daughters at bedside, questions answered          Patient has been seen and examined prior to discharge   Objective   Blood pressure 125/73, pulse 86, temperature 98.3 F (36.8 C), temperature source Oral, resp. rate 16, height 5\' 4"  (1.626 m), weight 88.5 kg (195 lb), SpO2 99 %.   Intake/Output Summary (Last 24 hours) at 11/21/16 1514 Last data filed at 11/21/16 1300  Gross per 24 hour  Intake              480 ml  Output                0 ml  Net              480 ml    Exam Gen:- Awake  In no apparent distress  HEENT:- Heil.AT,   Neck-Supple Neck,No JVD,  Lungs- mostly clear  CV- S1, S2 normal Abd-  +ve B.Sounds, Abd Soft, No tenderness,    Extremity/Skin:- Intact peripheral pulses   NeuroPsych- no new focal neuro deficits   Data Review   CBC w Diff: Lab Results  Component Value Date   WBC 6.5 11/20/2016   HGB 14.3 11/20/2016   HCT 42.0 11/20/2016   PLT 283 11/20/2016   LYMPHOPCT 42 11/20/2016   MONOPCT 7 11/20/2016   EOSPCT 2 11/20/2016   BASOPCT 1 11/20/2016    CMP: Lab Results  Component Value Date   NA 139 11/20/2016   K 3.8 11/20/2016   CL 107 11/20/2016   CO2 25 11/20/2016   BUN 15 11/20/2016   CREATININE 0.70 11/20/2016   PROT 7.3 11/20/2016   ALBUMIN 4.1 11/20/2016   BILITOT 0.5 11/20/2016   ALKPHOS 91 11/20/2016   AST 20 11/20/2016   ALT 28 11/20/2016  . Lab Results  Component Value Date   CHOL 237 (H)  11/21/2016   HDL 34 (L) 11/21/2016   LDLCALC 164 (H) 11/21/2016   TRIG 194 (H) 11/21/2016   CHOLHDL 7.0 11/21/2016     Total Discharge time is about 33 minutes  Marquez Ceesay M.D on 11/21/2016 at 3:14 PM  [facility]   Office  [phone]  Voice Recognition Reubin Milan dictation system was used to create this note, attempts have been made to correct errors. Please contact the author with questions and/or clarifications.

## 2016-11-21 NOTE — Discharge Instructions (Signed)
1)Follow-up with her primary doctor within the week to schedule outpatient stress test 2)Your good cholesterol is  low and your bad cholesterol is high-take low dose Lipitor/atorvastatin 10 mg daily and aspirin 81 mg daily as advised, recheck your cholesterol and liver test which her doctor in about 3 to 4  months         Stroke Prevention Some health problems and behaviors may make it more likely for you to have a stroke. Below are ways to lessen your risk of having a stroke.  Be active for at least 30 minutes on most or all days.  Do not smoke. Try not to be around others who smoke.  Do not drink too much alcohol.  Do not have more than 2 drinks a day if you are a man.  Do not have more than 1 drink a day if you are a woman and are not pregnant.  Eat healthy foods, such as fruits and vegetables. If you were put on a specific diet, follow the diet as told.  Keep your cholesterol levels under control through diet and medicines. Look for foods that are low in saturated fat, trans fat, cholesterol, and are high in fiber.  If you have diabetes, follow all diet plans and take your medicine as told.  Ask your doctor if you need treatment to lower your blood pressure. If you have high blood pressure (hypertension), follow all diet plans and take your medicine as told by your doctor.  If you are 78-71 years old, have your blood pressure checked every 3-5 years. If you are age 25 or older, have your blood pressure checked every year.  Keep a healthy weight. Eat foods that are low in calories, salt, saturated fat, trans fat, and cholesterol.  Do not take drugs.  Avoid birth control pills, if this applies. Talk to your doctor about the risks of taking birth control pills.  Talk to your doctor if you have sleep problems (sleep apnea).  Take all medicine as told by your doctor.  You may be told to take aspirin or blood thinner medicine. Take this medicine as told by your  doctor.  Understand your medicine instructions.  Make sure any other conditions you have are being taken care of. Get help right away if:  You suddenly lose feeling (you feel numb) or have weakness in your face, arm, or leg.  Your face or eyelid hangs down to one side.  You suddenly feel confused.  You have trouble talking (aphasia) or understanding what people are saying.  You suddenly have trouble seeing in one or both eyes.  You suddenly have trouble walking.  You are dizzy.  You lose your balance or your movements are clumsy (uncoordinated).  You suddenly have a very bad headache and you do not know the cause.  You have new chest pain.  Your heart feels like it is fluttering or skipping a beat (irregular heartbeat). Do not wait to see if the symptoms above go away. Get help right away. Call your local emergency services (911 in [place] Do not drive yourself to the hospital. This information is not intended to replace advice given to you by your health care provider. Make sure you discuss any questions you have with your health care provider. Document Released: 12/18/2011 Document Revised: 11/24/2015 Document Reviewed: 12/19/2012 Elsevier Interactive Patient Education  2017 [employer]  1)Follow-up with her primary doctor within the week to schedule outpatient stress test 2)Your good cholesterol is  low and your bad cholesterol is high-take low dose Lipitor/atorvastatin 10 mg daily and aspirin 81 mg daily as advised, recheck your cholesterol and liver test which her doctor in about 3 to 4  months

## 2016-11-21 NOTE — Progress Notes (Signed)
PT Cancellation Note  Patient Details Name: Valerie Turner MRN: [id number] DOB: Oct 30, 1955   Cancelled Treatment:    Reason Eval/Treat Not Completed: PT screened, no needs identified, will sign off (Pt received in room, and states that her balance, UE, and LE strength and coordination is all normal.  She does state that she feels like her R hand is colder than the left, but PT did not notice a difference in temperature.  She still has numbness/tingling of the L side of her face only. No PT needs at this time, will sign off.)   Beth Xian Alves, PT, DPT X: [phone]

## 2016-11-21 NOTE — Progress Notes (Signed)
Patient discharging home.  IV removed - WNL.  Reviewed DC instructions and medications.  Instructed to follow up with PCP. Education given on stroke prevention.  Verbalizes understanding - no questions at this time.  Patient in NAD.

## 2016-11-21 NOTE — Progress Notes (Signed)
*  PRELIMINARY RESULTS* Echocardiogram 2D Echocardiogram has been performed.  [name], [name] 11/21/2016, 11:07 AM

## 2016-11-22 LAB — HEMOGLOBIN A1C
Hgb A1c MFr Bld: 5.4 % (ref 4.8–5.6)
Mean Plasma Glucose: 108 mg/dL

## 2018-08-28 IMAGING — MR MR MRA HEAD W/O CM
1 series · 13 of 48 positions shown · non-contrast
Comparison: CT HEAD November 20, 2016 at 8581 hours

CLINICAL DATA: LEFT facial numbness, assess for stroke.

EXAM:
MRI HEAD WITHOUT CONTRAST
MRA HEAD WITHOUT CONTRAST
TECHNIQUE: Multiplanar, multiecho pulse sequences of the brain and surrounding
structures were obtained without intravenous contrast. Angiographic
images of the head were obtained using MRA technique without
contrast.

[Series 3: MRA · axial · 0.7mm · 0.35mm/px · z∈[-94,+13]mm · 13 of 162 slices shown]
[im 1/162]
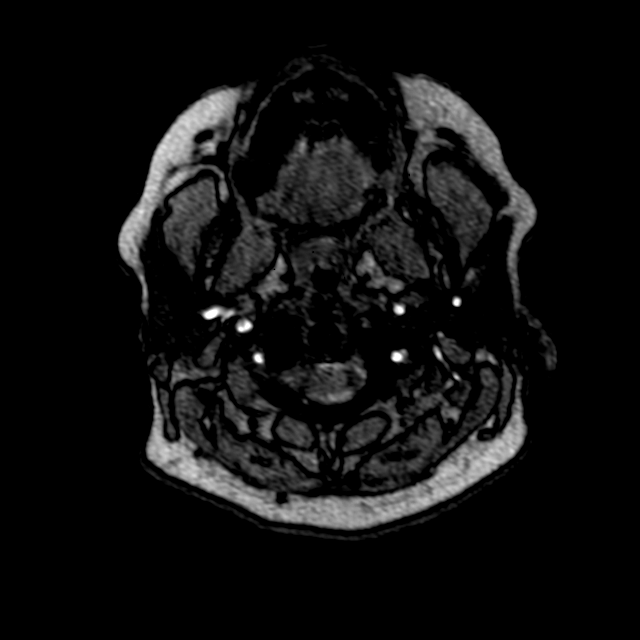
[im 4/162]
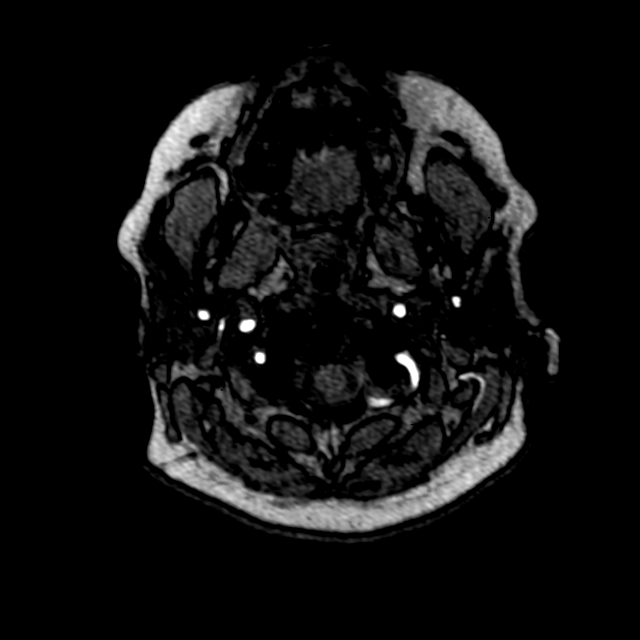
[im 11/162]
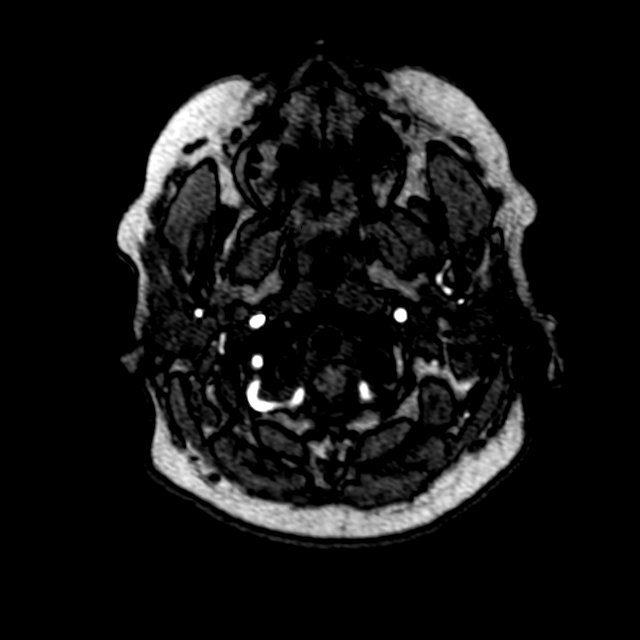
[im 28/162]
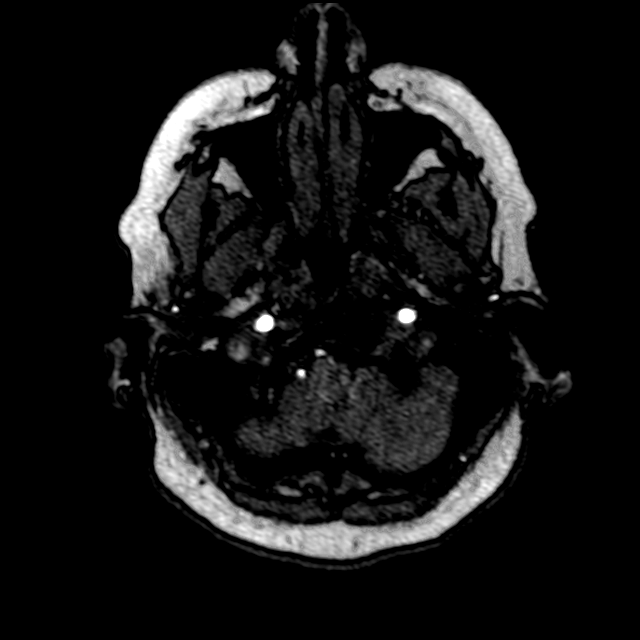
[im 31/162]
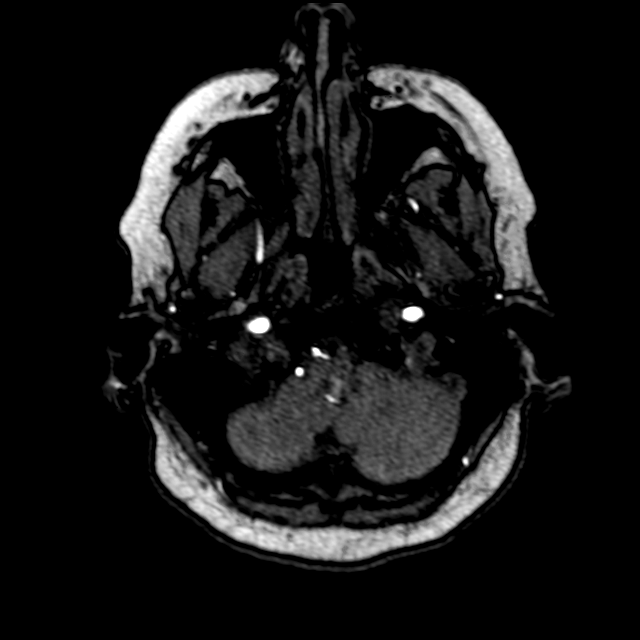
[im 52/162]
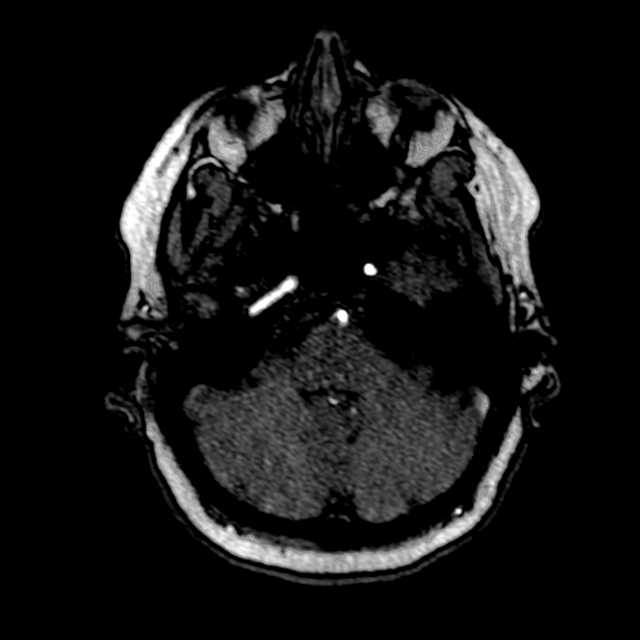
[im 72/162]
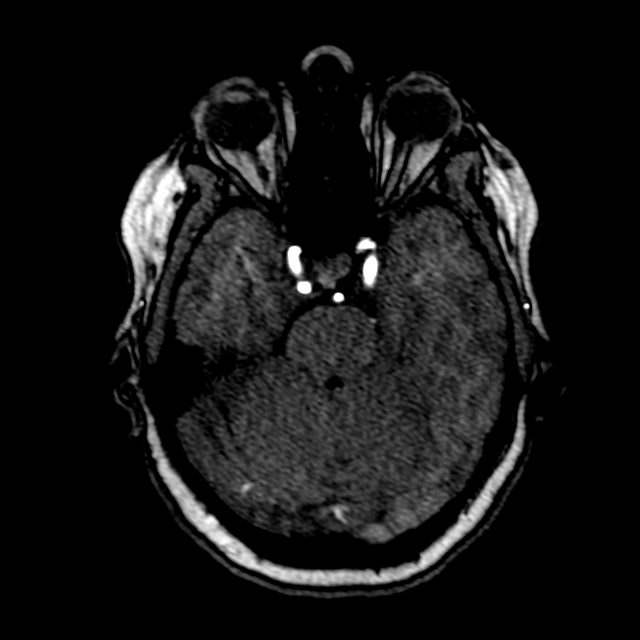
[im 83/162]
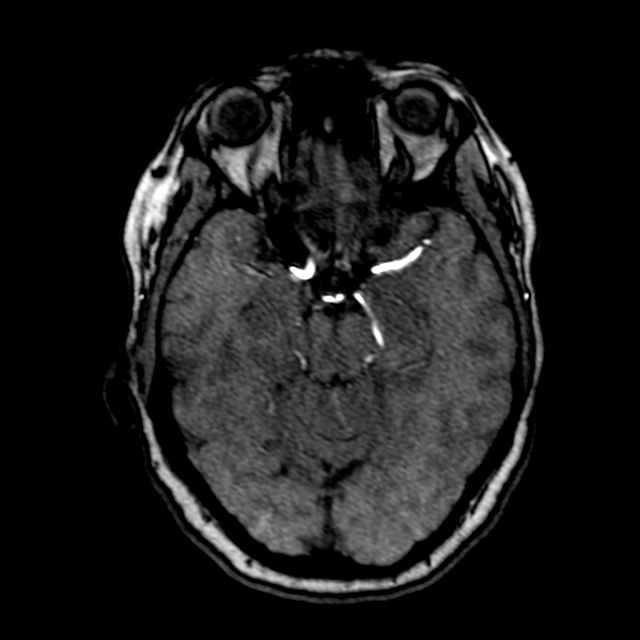
[im 93/162]
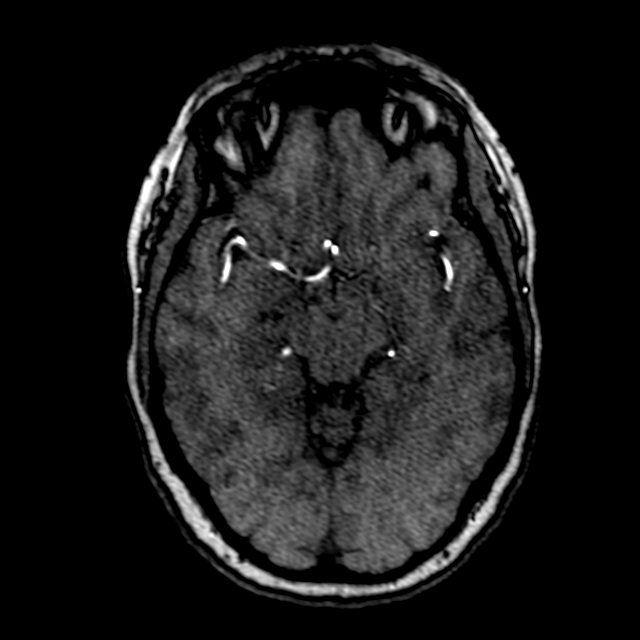
[im 114/162]
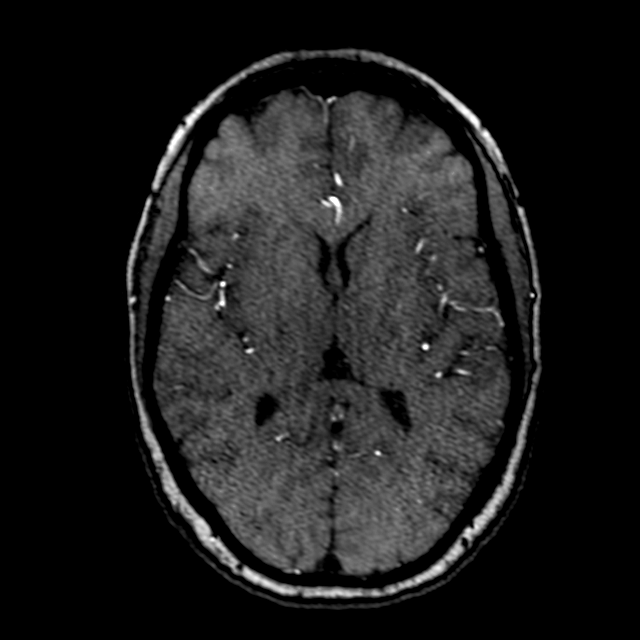
[im 134/162]
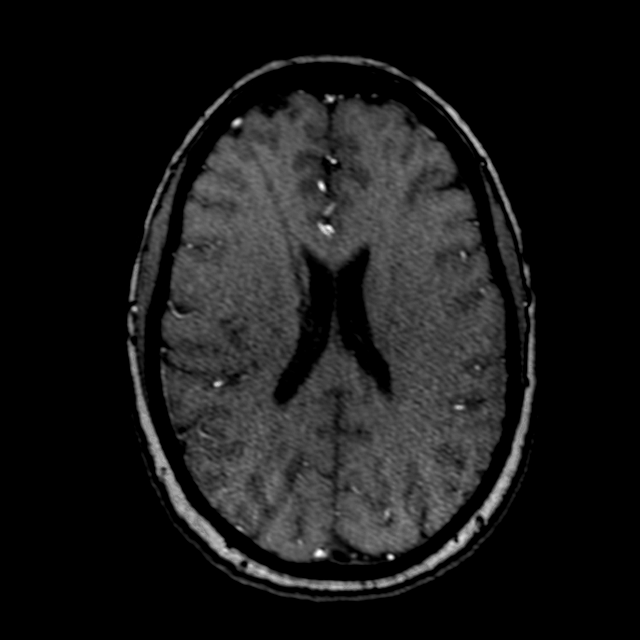
[im 138/162]
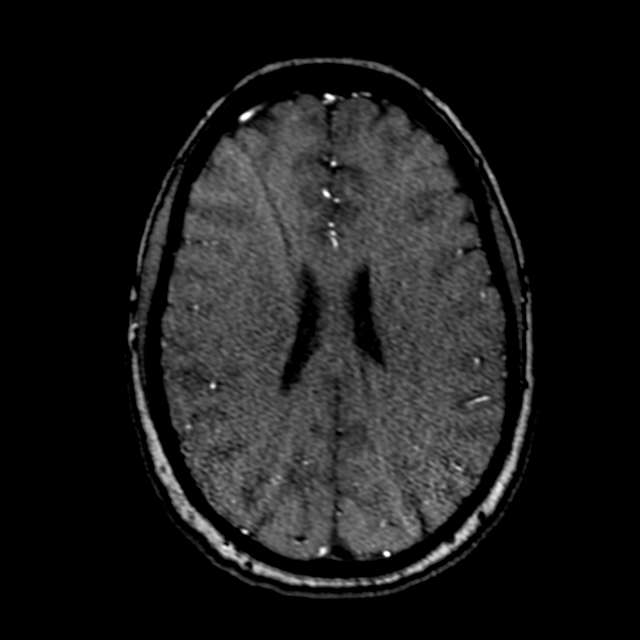
[im 155/162]
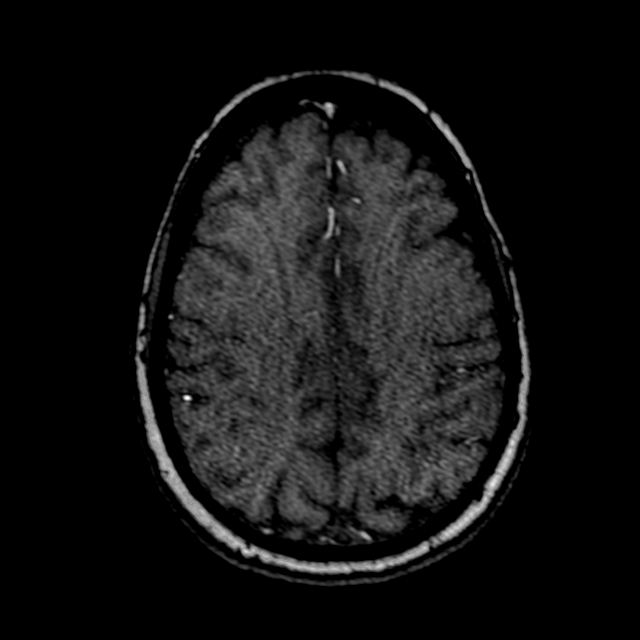

[13 of 48 positions shown; findings below may reference images not displayed]

FINDINGS: MRI HEAD FINDINGS

BRAIN: No reduced diffusion to suggest acute ischemia. No
susceptibility artifact to suggest hemorrhage. The ventricles and
sulci are normal for patient's age. Scattered subcentimeter
supratentorial white matter FLAIR T2 hyperintensities in a
nonspecific distribution including LEFT temporal lobe. No suspicious
parenchymal signal, masses or mass effect. No abnormal extra-axial
fluid collections.

VASCULAR: Normal major intracranial vascular flow voids present at
skull base.

SKULL AND UPPER CERVICAL SPINE: No abnormal sellar expansion. No
suspicious calvarial bone marrow signal. Craniocervical junction
maintained.

SINUSES/ORBITS: Trace paranasal sinus mucosal thickening. Mastoid
air cells are well aerated. The included ocular globes and orbital
contents are non-suspicious.

OTHER: None.

MRA HEAD FINDINGS

ANTERIOR CIRCULATION: Normal flow related enhancement of the
included cervical, petrous, cavernous and supraclinoid internal
carotid arteries. Patent anterior communicating artery. Normal flow
related enhancement of the anterior and middle cerebral arteries,
including distal segments.

No large vessel occlusion, high-grade stenosis, abnormal luminal
irregularity, aneurysm.

POSTERIOR CIRCULATION: vertebral artery is dominant. Basilar artery
is patent, with normal flow related enhancement of the main branch
vessels. Normal flow related enhancement of the posterior cerebral
arteries.

No large vessel occlusion, high-grade stenosis, abnormal luminal
irregularity, aneurysm.

ANATOMIC VARIANTS: None.

Source images and MIP images were reviewed.
IMPRESSION: MRI HEAD: No acute intracranial process.

Mild to moderate white matter changes seen with chronic small vessel
ischemic disease, migraine, prior infection; atypical distribution
for classic demyelination. Findings also associated with
vasculopathy though, unlikely given normal MRA head.

MRA HEAD: Normal.
